# Patient Record
Sex: Male | Born: 1975
Health system: Southern US, Community
[De-identification: ages and names within clinical notes are randomized; demographics above are authoritative.]

## PROBLEM LIST (undated history)

## (undated) DIAGNOSIS — IMO0001 Reserved for inherently not codable concepts without codable children: Secondary | ICD-10-CM

## (undated) DIAGNOSIS — K219 Gastro-esophageal reflux disease without esophagitis: Secondary | ICD-10-CM

## (undated) DIAGNOSIS — R030 Elevated blood-pressure reading, without diagnosis of hypertension: Secondary | ICD-10-CM

## (undated) DIAGNOSIS — N390 Urinary tract infection, site not specified: Secondary | ICD-10-CM

## (undated) HISTORY — PX: SHOULDER SURGERY: SHX246

## (undated) HISTORY — DX: Reserved for inherently not codable concepts without codable children: IMO0001

## (undated) HISTORY — DX: Elevated blood-pressure reading, without diagnosis of hypertension: R03.0

## (undated) HISTORY — PX: WISDOM TOOTH EXTRACTION: SHX21

## (undated) HISTORY — DX: Gastro-esophageal reflux disease without esophagitis: K21.9

## (undated) HISTORY — DX: Urinary tract infection, site not specified: N39.0

---

## 2011-04-29 ENCOUNTER — Encounter (HOSPITAL_COMMUNITY): Payer: Self-pay | Admitting: *Deleted

## 2011-04-29 ENCOUNTER — Emergency Department (HOSPITAL_COMMUNITY): Payer: Self-pay

## 2011-04-29 ENCOUNTER — Emergency Department (HOSPITAL_COMMUNITY)
Admission: EM | Admit: 2011-04-29 | Discharge: 2011-04-29 | Disposition: A | Discharge status: Home / Self Care | Payer: Self-pay | Attending: Emergency Medicine | Admitting: Emergency Medicine

## 2011-04-29 DIAGNOSIS — R109 Unspecified abdominal pain: Secondary | ICD-10-CM | POA: Insufficient documentation

## 2011-04-29 DIAGNOSIS — R079 Chest pain, unspecified: Secondary | ICD-10-CM | POA: Insufficient documentation

## 2011-04-29 DIAGNOSIS — J45909 Unspecified asthma, uncomplicated: Secondary | ICD-10-CM | POA: Insufficient documentation

## 2011-04-29 LAB — DIFFERENTIAL
Basophils Absolute: 0.1 10*3/uL (ref 0.0–0.1)
Basophils Relative: 1 % (ref 0–1)
Eosinophils Absolute: 0.2 10*3/uL (ref 0.0–0.7)
Lymphocytes Relative: 20 % (ref 12–46)
Lymphs Abs: 1.2 10*3/uL (ref 0.7–4.0)
Monocytes Absolute: 0.4 10*3/uL (ref 0.1–1.0)
Monocytes Relative: 8 % (ref 3–12)
Neutro Abs: 3.9 10*3/uL (ref 1.7–7.7)
Neutrophils Relative %: 68 % (ref 43–77)

## 2011-04-29 LAB — CK TOTAL AND CKMB (NOT AT ARMC)
CK, MB: 2.1 ng/mL (ref 0.3–4.0)
Relative Index: 1.2 (ref 0.0–2.5)
Total CK: 171 U/L (ref 7–232)

## 2011-04-29 LAB — LIPASE, BLOOD: Lipase: 36 U/L (ref 11–59)

## 2011-04-29 LAB — COMPREHENSIVE METABOLIC PANEL
AST: 19 U/L (ref 0–37)
Albumin: 3.8 g/dL (ref 3.5–5.2)
BUN: 7 mg/dL (ref 6–23)
Calcium: 9.3 mg/dL (ref 8.4–10.5)
Creatinine, Ser: 1.11 mg/dL (ref 0.50–1.35)
Total Protein: 7.6 g/dL (ref 6.0–8.3)

## 2011-04-29 LAB — CBC
MCV: 81.5 fL (ref 78.0–100.0)
Platelets: 229 10*3/uL (ref 150–400)
RBC: 5.04 MIL/uL (ref 4.22–5.81)
WBC: 5.7 10*3/uL (ref 4.0–10.5)

## 2011-04-29 LAB — TROPONIN I: Troponin I: <0.3 ng/mL (ref <0.30)

## 2011-04-29 MED ORDER — MORPHINE SULFATE 4 MG/ML IJ SOLN
INTRAMUSCULAR | Status: AC
  Filled 2011-04-29: qty 1

## 2011-04-29 MED ORDER — IOHEXOL 300 MG/ML  SOLN
20.0000 mL | INTRAMUSCULAR | Status: AC
  Administered 2011-04-29 (×2): 20 mL via ORAL

## 2011-04-29 MED ORDER — MORPHINE SULFATE 4 MG/ML IJ SOLN
8.0000 mg | Freq: Once | INTRAMUSCULAR | Status: AC
  Administered 2011-04-29: 8 mg via INTRAVENOUS
  Filled 2011-04-29: qty 1

## 2011-04-29 MED ORDER — FAMOTIDINE 20 MG PO TABS: 20.0000 mg | ORAL_TABLET | Freq: Two times a day (BID) | ORAL | Status: DC

## 2011-04-29 MED ORDER — HYDROCODONE-ACETAMINOPHEN 5-325 MG PO TABS: 1.0000 | ORAL_TABLET | ORAL | Status: AC | PRN

## 2011-04-29 MED ORDER — IOHEXOL 300 MG/ML  SOLN
100.0000 mL | Freq: Once | INTRAMUSCULAR | Status: AC | PRN
  Administered 2011-04-29: 100 mL via INTRAVENOUS

## 2011-04-29 NOTE — ED Provider Notes (Signed)
History   This chart was scribed for Lyanne Co, MD by Clarita Crane. The patient was seen in room STRE3/STRE3. Patient's care was started at 1706.    CSN: [id number]  Arrival date & time 04/29/11  1706   None     Chief Complaint  Patient presents with  . Abdominal Pain     HPI Devin Tate is a 36 y.o. male who presents to the Emergency Department complaining of intermittent moderate left sided abdominal pain radiating to left side of chest onset 1 year ago which generally occurs after eating but pain became worse today and started without eating anything prior. Reports episodes of abdominal pain which started today has been constant since onset. Rates abdominal pain an 8 out of 10 currently but 10 out of 10 at its worst. Denies dysuria, nausea, vomiting, diarrhea, blood in stool, melena, constipation. Patient denies h/o abdominal surgery.   Past Medical History  Diagnosis Date  . Asthma     History reviewed. No pertinent past surgical history.  No family history on file.  History  Substance Use Topics  . Smoking status: Never Smoker   . Smokeless tobacco: Not on file  . Alcohol Use: Yes      Review of Systems  Constitutional: Negative for fever and chills.  Respiratory: Negative for shortness of breath.   Gastrointestinal: Positive for abdominal pain. Negative for nausea, vomiting, diarrhea and blood in stool.    Allergies  Review of patient's allergies indicates no known allergies.  Home Medications   Current Outpatient Rx  Name Route Sig Dispense Refill  . MELATONIN 5 MG PO TABS Oral Take 1 tablet by mouth at bedtime as needed. FOR INSOMNIA      BP 127/88  Pulse 71  Temp(Src) 98.4 F (36.9 C) (Oral)  Resp 18  SpO2 98%  Physical Exam  Nursing note and vitals reviewed. Constitutional: He is oriented to person, place, and time. He appears well-developed and well-nourished. No distress.  HENT:  Head: Normocephalic and atraumatic.  Eyes: EOM are  normal. Pupils are equal, round, and reactive to light.  Neck: Neck supple. No tracheal deviation present.  Cardiovascular: Normal rate.   Pulmonary/Chest: Effort normal. No respiratory distress.  Abdominal: Soft. He exhibits no distension. There is tenderness (left sided). There is no rebound and no guarding.  Musculoskeletal: Normal range of motion. He exhibits no edema.  Neurological: He is alert and oriented to person, place, and time. No sensory deficit.  Skin: Skin is warm and dry.  Psychiatric: He has a normal mood and affect. His behavior is normal.    ED Course  Procedures (including critical care time)   Date: 04/29/2011  Rate: 65  Rhythm: normal sinus rhythm  QRS Axis: normal  Intervals: normal  ST/T Wave abnormalities: normal  Conduction Disutrbances: none  Narrative Interpretation:   Old EKG Reviewed: No significant changes noted     DIAGNOSTIC STUDIES: Oxygen Saturation is 98% on room air, normal by my interpretation.    COORDINATION OF CARE: 7:35PM- Patient informed of current plan for treatment and evaluation and agrees with plan at this time.     Labs Reviewed  COMPREHENSIVE METABOLIC PANEL - Abnormal; Notable for the following:    Glucose, Bld 100 (*)    GFR calc non Af Amer 85 (*)    All other components within normal limits  CBC  DIFFERENTIAL  CK TOTAL AND CKMB  TROPONIN I  LIPASE, BLOOD   Dg Chest 2 View  04/29/2011  *RADIOLOGY REPORT*  Clinical Data: Chest and abdominal pain.  Hypertension  CHEST - 2 VIEW  Comparison: None  Findings: The heart size and mediastinal contours are within normal limits.  Both lungs are clear.  The visualized skeletal structures are unremarkable.  IMPRESSION: Negative exam.  Original Report Authenticated By: Rosealee Albee, M.D.     No diagnosis found.    MDM  The patient has had intermittent abdominal pain for approximately one year.  He is now tender on the left side of his abdomen this pain has been  constant.  This abdominal pain is changed from his prior baseline abdominal pain.  Labs and CT scan and urinalysis will be obtained today.  I suspect all of this to be normal.  The patient will then need to be referred to gastroenterology for workup of his ongoing intermittent abdominal pain.  The patient's pain will be treated.  The patient will continue his evaluation in the clinical decision unit.  I made nothing of his chest pain and he didn't even mention this to me until the very end.  His symptoms do not sound like a PE or ACS      I personally performed the services described in this documentation, which was scribed in my presence. The recorded information has been reviewed and considered.      Lyanne Co, MD 04/29/11 [id number]

## 2011-04-29 NOTE — ED Provider Notes (Signed)
Received patient from Dr Patria Mane at change of shift.  Patient with chronic left sided abdominal pain, worse immediately after eating.  Labs, CT unremarkable.  Plan is for d/c home with GI follow up.  I have discussed all results and plan with patient, will d/c home with H2 blocker and pain medication for break through pain.  I have discussed safety with pain medications with the patient, including monitoring tylenol intake.  Patient has no needs at present.  Patient verbalizes understanding and agrees with plan.    Rise Patience, [place] 04/29/11 2200

## 2011-04-29 NOTE — ED Notes (Signed)
The pt has been having lt sided chest pain and abd pain for one year.  He has a history of the same [place] state.  nausea

## 2011-04-29 NOTE — Discharge Instructions (Signed)
Read the information below.  Your labs and CT do not show the cause of your abdominal pain. Please call the GI specialist listed above to schedule a follow up appointment.  Do not take additional tylenol while taking the prescribed pain medication.  Do not drive or operate machinery while taking this medication.  You may return to the ER at any time for worsening condition or any new symptoms that concern you.   Abdominal Pain Abdominal pain can be caused by many things. Your caregiver decides the seriousness of your pain by an examination and possibly blood tests and X-rays. Many cases can be observed and treated at home. Most abdominal pain is not caused by a disease and will probably improve without treatment. However, in many cases, more time must pass before a clear cause of the pain can be found. Before that point, it may not be known if you need more testing, or if hospitalization or surgery is needed. HOME CARE INSTRUCTIONS   Do not take laxatives unless directed by your caregiver.   Take pain medicine only as directed by your caregiver.   Only take over-the-counter or prescription medicines for pain, discomfort, or fever as directed by your caregiver.   Try a clear liquid diet (broth, tea, or water) for as long as directed by your caregiver. Slowly move to a bland diet as tolerated.  SEEK IMMEDIATE MEDICAL CARE IF:   The pain does not go away.   You have a fever.   You keep throwing up (vomiting).   The pain is felt only in portions of the abdomen. Pain in the right side could possibly be appendicitis. In an adult, pain in the left lower portion of the abdomen could be colitis or diverticulitis.   You pass bloody or black tarry stools.  MAKE SURE YOU:   Understand these instructions.   Will watch your condition.   Will get help right away if you are not doing well or get worse.  Document Released: 10/02/2004 Document Revised: 12/12/2010 Document Reviewed:  08/11/2007 [facility] Patient Information 2012 [place], [place].

## 2011-04-29 NOTE — ED Provider Notes (Signed)
I was the primary provider of this patient during this ER visit. The patients care was continued in the CDU and managed in conjunction with my midlevel providers   Lyanne Co, MD 04/29/11 2230

## 2011-04-29 NOTE — ED Notes (Signed)
Pt c/o left abd pain off and on x's 1 yr. Also c/o chest pain off and on x's 1 yr.

## 2011-04-29 NOTE — ED Notes (Signed)
Pt drinking contrast at this time.

## 2013-10-11 ENCOUNTER — Encounter (HOSPITAL_COMMUNITY): Payer: Self-pay | Admitting: Emergency Medicine

## 2013-10-11 ENCOUNTER — Emergency Department (HOSPITAL_COMMUNITY): Payer: BC Managed Care – PPO

## 2013-10-11 ENCOUNTER — Emergency Department (HOSPITAL_COMMUNITY)
Admission: EM | Admit: 2013-10-11 | Discharge: 2013-10-11 | Disposition: A | Discharge status: Home / Self Care | Payer: BC Managed Care – PPO | Attending: Emergency Medicine | Admitting: Emergency Medicine

## 2013-10-11 DIAGNOSIS — R1110 Vomiting, unspecified: Secondary | ICD-10-CM | POA: Insufficient documentation

## 2013-10-11 DIAGNOSIS — R197 Diarrhea, unspecified: Secondary | ICD-10-CM | POA: Insufficient documentation

## 2013-10-11 DIAGNOSIS — J069 Acute upper respiratory infection, unspecified: Secondary | ICD-10-CM | POA: Diagnosis not present

## 2013-10-11 DIAGNOSIS — J45901 Unspecified asthma with (acute) exacerbation: Secondary | ICD-10-CM | POA: Insufficient documentation

## 2013-10-11 DIAGNOSIS — Z794 Long term (current) use of insulin: Secondary | ICD-10-CM | POA: Diagnosis not present

## 2013-10-11 DIAGNOSIS — R0602 Shortness of breath: Secondary | ICD-10-CM | POA: Diagnosis present

## 2013-10-11 LAB — I-STAT CHEM 8, ED
BUN: 12 mg/dL (ref 6–23)
CHLORIDE: 103 meq/L (ref 96–112)
CREATININE: 1.2 mg/dL (ref 0.50–1.35)
Calcium, Ion: 1.1 mmol/L — ABNORMAL LOW (ref 1.12–1.23)
GLUCOSE: 122 mg/dL — AB (ref 70–99)
HCT: 44 % (ref 39.0–52.0)
Hemoglobin: 15 g/dL (ref 13.0–17.0)
POTASSIUM: 4.2 meq/L (ref 3.7–5.3)
SODIUM: 140 meq/L (ref 137–147)
TCO2: 29 mmol/L (ref 0–100)

## 2013-10-11 MED ORDER — ONDANSETRON 8 MG PO TBDP: 8.0000 mg | ORAL_TABLET | Freq: Three times a day (TID) | ORAL | Status: DC | PRN

## 2013-10-11 MED ORDER — NAPROXEN 500 MG PO TABS: 500.0000 mg | ORAL_TABLET | Freq: Two times a day (BID) | ORAL | Status: DC

## 2013-10-11 MED ORDER — SODIUM CHLORIDE 0.9 % IV SOLN
1000.0000 mL | INTRAVENOUS | Status: DC
  Administered 2013-10-11: 1000 mL via INTRAVENOUS

## 2013-10-11 MED ORDER — ONDANSETRON HCL 4 MG/2ML IJ SOLN
4.0000 mg | Freq: Once | INTRAMUSCULAR | Status: AC
  Administered 2013-10-11: 4 mg via INTRAVENOUS
  Filled 2013-10-11: qty 2

## 2013-10-11 MED ORDER — BENZONATATE 100 MG PO CAPS: 100.0000 mg | ORAL_CAPSULE | Freq: Three times a day (TID) | ORAL | Status: DC

## 2013-10-11 MED ORDER — SODIUM CHLORIDE 0.9 % IV SOLN
1000.0000 mL | Freq: Once | INTRAVENOUS | Status: AC
Start: 2013-10-11 — End: 2013-10-11
  Administered 2013-10-11: 1000 mL via INTRAVENOUS

## 2013-10-11 NOTE — ED Notes (Signed)
Pt states shortness of breath, cough, nausea, congestion, fever and diarrhea x 72 hours.  Pt states highest fever was last night at 11 and was 102.4.  Took ibuprofen at 11pm.  No ibuprofen today.  Tempt 98.5 in triage.

## 2013-10-11 NOTE — Discharge Instructions (Signed)
Cough, Adult ° A cough is a reflex. It helps you clear your throat and airways. A cough can help heal your body. A cough can last 2 or 3 weeks (acute) or may last more than 8 weeks (chronic). Some common causes of a cough can include an infection, allergy, or a cold. °HOME CARE °· Only take medicine as told by your doctor. °· If given, take your medicines (antibiotics) as told. Finish them even if you start to feel better. °· Use a cold steam vaporizer or humidifier in your home. This can help loosen thick spit (secretions). °· Sleep so you are almost sitting up (semi-upright). Use pillows to do this. This helps reduce coughing. °· Rest as needed. °· Stop smoking if you smoke. °GET HELP RIGHT AWAY IF: °· You have yellowish-white fluid (pus) in your thick spit. °· Your cough gets worse. °· Your medicine does not reduce coughing, and you are losing sleep. °· You cough up blood. °· You have trouble breathing. °· Your pain gets worse and medicine does not help. °· You have a fever. °MAKE SURE YOU:  °· Understand these instructions. °· Will watch your condition. °· Will get help right away if you are not doing well or get worse. °Document Released: 09/05/2010 Document Revised: 05/09/2013 Document Reviewed: 09/05/2010 °ExitCare® Patient Information ©2015 [employer]. This information is not intended to replace advice given to you by your health care provider. Make sure you discuss any questions you have with your health care provider. ° °Upper Respiratory Infection, Adult °An upper respiratory infection (URI) is also sometimes known as the common cold. The upper respiratory tract includes the nose, sinuses, throat, trachea, and bronchi. Bronchi are the airways leading to the lungs. Most people improve within 1 week, but symptoms can last up to 2 weeks. A residual cough may last even longer.  °CAUSES °Many different viruses can infect the tissues lining the upper respiratory tract. The tissues become irritated and  inflamed and often become very moist. Mucus production is also common. A cold is contagious. You can easily spread the virus to others by oral contact. This includes kissing, sharing a glass, coughing, or sneezing. Touching your mouth or nose and then touching a surface, which is then touched by another person, can also spread the virus. °SYMPTOMS  °Symptoms typically develop 1 to 3 days after you come in contact with a cold virus. Symptoms vary from person to person. They may include: °· Runny nose. °· Sneezing. °· Nasal congestion. °· Sinus irritation. °· Sore throat. °· Loss of voice (laryngitis). °· Cough. °· Fatigue. °· Muscle aches. °· Loss of appetite. °· Headache. °· Low-grade fever. °DIAGNOSIS  °You might diagnose your own cold based on familiar symptoms, since most people get a cold 2 to 3 times a year. Your caregiver can confirm this based on your exam. Most importantly, your caregiver can check that your symptoms are not due to another disease such as strep throat, sinusitis, pneumonia, asthma, or epiglottitis. Blood tests, throat tests, and X-rays are not necessary to diagnose a common cold, but they may sometimes be helpful in excluding other more serious diseases. Your caregiver will decide if any further tests are required. °RISKS AND COMPLICATIONS  °You may be at risk for a more severe case of the common cold if you smoke cigarettes, have chronic heart disease (such as heart failure) or lung disease (such as asthma), or if you have a weakened immune system. The very young and very old are   also at risk for more serious infections. Bacterial sinusitis, middle ear infections, and bacterial pneumonia can complicate the common cold. The common cold can worsen asthma and chronic obstructive pulmonary disease (COPD). Sometimes, these complications can require emergency medical care and may be life-threatening. °PREVENTION  °The best way to protect against getting a cold is to practice good hygiene. Avoid  oral or hand contact with people with cold symptoms. Wash your hands often if contact occurs. There is no clear evidence that vitamin C, vitamin E, echinacea, or exercise reduces the chance of developing a cold. However, it is always recommended to get plenty of rest and practice good nutrition. °TREATMENT  °Treatment is directed at relieving symptoms. There is no cure. Antibiotics are not effective, because the infection is caused by a virus, not by bacteria. Treatment may include: °· Increased fluid intake. Sports drinks offer valuable electrolytes, sugars, and fluids. °· Breathing heated mist or steam (vaporizer or shower). °· Eating chicken soup or other clear broths, and maintaining good nutrition. °· Getting plenty of rest. °· Using gargles or lozenges for comfort. °· Controlling fevers with ibuprofen or acetaminophen as directed by your caregiver. °· Increasing usage of your inhaler if you have asthma. °Zinc gel and zinc lozenges, taken in the first 24 hours of the common cold, can shorten the duration and lessen the severity of symptoms. Pain medicines may help with fever, muscle aches, and throat pain. A variety of non-prescription medicines are available to treat congestion and runny nose. Your caregiver can make recommendations and may suggest nasal or lung inhalers for other symptoms.  °HOME CARE INSTRUCTIONS  °· Only take over-the-counter or prescription medicines for pain, discomfort, or fever as directed by your caregiver. °· Use a warm mist humidifier or inhale steam from a shower to increase air moisture. This may keep secretions moist and make it easier to breathe. °· Drink enough water and fluids to keep your urine clear or pale yellow. °· Rest as needed. °· Return to work when your temperature has returned to normal or as your caregiver advises. You may need to stay home longer to avoid infecting others. You can also use a face mask and careful hand washing to prevent spread of the virus. °SEEK  MEDICAL CARE IF:  °· After the first few days, you feel you are getting worse rather than better. °· You need your caregiver's advice about medicines to control symptoms. °· You develop chills, worsening shortness of breath, or brown or red sputum. These may be signs of pneumonia. °· You develop yellow or brown nasal discharge or pain in the face, especially when you bend forward. These may be signs of sinusitis. °· You develop a fever, swollen neck glands, pain with swallowing, or white areas in the back of your throat. These may be signs of strep throat. °SEEK IMMEDIATE MEDICAL CARE IF:  °· You have a fever. °· You develop severe or persistent headache, ear pain, sinus pain, or chest pain. °· You develop wheezing, a prolonged cough, cough up blood, or have a change in your usual mucus (if you have chronic lung disease). °· You develop sore muscles or a stiff neck. °Document Released: 06/18/2000 Document Revised: 03/17/2011 Document Reviewed: 03/30/2013 °ExitCare® Patient Information ©2015 [employer]. This information is not intended to replace advice given to you by your health care provider. Make sure you discuss any questions you have with your health care provider. ° °

## 2013-10-11 NOTE — ED Provider Notes (Signed)
CSN: [id number]     Arrival date & time 10/11/13  4098 History   First MD Initiated Contact with Patient 10/11/13 [phone]     Chief Complaint  Patient presents with  . Shortness of Breath  . Generalized Body Aches    Patient is a 38 y.o. male presenting with shortness of breath. The history is provided by the patient.  Shortness of Breath Severity:  Moderate Onset quality:  Gradual Duration:  3 days Timing:  Constant Progression:  Worsening Relieved by:  Nothing Associated symptoms: cough, fever and vomiting   Associated symptoms: no abdominal pain, no chest pain, no sore throat, no sputum production and no wheezing   Symptoms have progressed over the last few days.  Fever up to 102.  Did not feel well enough to go to work today.  Some vomiting and diarrhea.  No abdominal pain.  Diffuse myalgias.  No foreign travel.  No abx recently.  Past Medical History  Diagnosis Date  . Asthma    History reviewed. No pertinent past surgical history. History reviewed. No pertinent family history. History  Substance Use Topics  . Smoking status: Never Smoker   . Smokeless tobacco: Not on file  . Alcohol Use: Yes    Review of Systems  Constitutional: Positive for fever.  HENT: Negative for sore throat.   Respiratory: Positive for cough and shortness of breath. Negative for sputum production and wheezing.   Cardiovascular: Negative for chest pain.  Gastrointestinal: Positive for vomiting and diarrhea. Negative for abdominal pain.      Allergies  Review of patient's allergies indicates no known allergies.  Home Medications   Prior to Admission medications   Medication Sig Start Date End Date Taking? Authorizing Provider  ibuprofen (ADVIL,MOTRIN) 200 MG tablet Take 600 mg by mouth once.   Yes Historical Provider, MD  Melatonin 5 MG TABS Take 1 tablet by mouth at bedtime as needed. FOR INSOMNIA   Yes Historical Provider, MD   BP 153/87  Pulse 74  Temp(Src) 98.5 F (36.9 C) (Oral)   Ht 5\' 11"  (1.803 m)  Wt 220 lb (99.791 kg)  BMI 30.70 kg/m2  SpO2 97% Physical Exam  Nursing note and vitals reviewed. Constitutional: He appears well-developed and well-nourished. No distress.  HENT:  Head: Normocephalic and atraumatic.  Right Ear: External ear normal.  Left Ear: External ear normal.  Eyes: Conjunctivae are normal. Right eye exhibits no discharge. Left eye exhibits no discharge. No scleral icterus.  Neck: Neck supple. No tracheal deviation present.  Cardiovascular: Normal rate, regular rhythm and intact distal pulses.   Pulmonary/Chest: Effort normal and breath sounds normal. No stridor. No respiratory distress. He has no wheezes. He has no rales.  Abdominal: Soft. Bowel sounds are normal. He exhibits no distension. There is no tenderness. There is no rebound and no guarding.  Musculoskeletal: He exhibits no edema and no tenderness.  Neurological: He is alert. He has normal strength. No cranial nerve deficit (no facial droop, extraocular movements intact, no slurred speech) or sensory deficit. He exhibits normal muscle tone. He displays no seizure activity. Coordination normal.  Skin: Skin is warm and dry. No rash noted.  Psychiatric: He has a normal mood and affect.    ED Course  Procedures (including critical care time) Labs Review Labs Reviewed  I-STAT CHEM 8, ED - Abnormal; Notable for the following:    Glucose, Bld 122 (*)    Calcium, Ion 1.10 (*)    All other components within normal limits  Imaging Review Dg Chest 2 View  10/11/2013   CLINICAL DATA:  Shortness of breath, generalized body ache for 3 days, nonsmoker  EXAM: CHEST  2 VIEW  COMPARISON:  04/29/2011  FINDINGS: Cardiomediastinal silhouette is stable. No acute infiltrate or pleural effusion. No pulmonary edema. Bony thorax is unremarkable.  IMPRESSION: No active cardiopulmonary disease.   Electronically Signed   By: Natasha MeadLiviu  Pop M.D.   On: 10/11/2013 09:48     EKG Interpretation   Date/Time:   Tuesday October 11 2013 09:11:53 EDT Ventricular Rate:  70 PR Interval:  152 QRS Duration: 84 QT Interval:  384 QTC Calculation: 414 R Axis:   50 Text Interpretation:  Sinus rhythm Baseline wander in lead(s) V2 No  significant change since last tracing EKG should not have been ordered,  please discard Confirmed by Kendricks Reap  MD-J, Torrie Lafavor ([id number]) on 10/11/2013 9:19:49  AM      MDM   Final diagnoses:  URI, acute    Symptoms are consistent with a simple upper respiratory infection. There is no evidence to suggest pneumonia on my exam. The patient does not appear to have an otitis media. I discussed supportive treatment. I encouraged followup with the primary care doctor next week if symptoms have not resolved. Warning signs and reasons to return to the emergency room were discussed      Linwood DibblesJon Yulieth Carrender, MD 10/11/13 1050

## 2013-11-07 ENCOUNTER — Ambulatory Visit: Payer: Self-pay | Admitting: Internal Medicine

## 2014-01-18 ENCOUNTER — Ambulatory Visit (INDEPENDENT_AMBULATORY_CARE_PROVIDER_SITE_OTHER): Payer: BLUE CROSS/BLUE SHIELD | Admitting: Internal Medicine

## 2014-01-18 ENCOUNTER — Encounter: Payer: Self-pay | Admitting: Internal Medicine

## 2014-01-18 VITALS — BP 138/90 | HR 75 | Temp 97.5°F | Ht 71.0 in | Wt 222.0 lb

## 2014-01-18 DIAGNOSIS — IMO0001 Reserved for inherently not codable concepts without codable children: Secondary | ICD-10-CM | POA: Insufficient documentation

## 2014-01-18 DIAGNOSIS — F419 Anxiety disorder, unspecified: Secondary | ICD-10-CM | POA: Insufficient documentation

## 2014-01-18 DIAGNOSIS — Z0000 Encounter for general adult medical examination without abnormal findings: Secondary | ICD-10-CM

## 2014-01-18 DIAGNOSIS — Z23 Encounter for immunization: Secondary | ICD-10-CM

## 2014-01-18 DIAGNOSIS — K219 Gastro-esophageal reflux disease without esophagitis: Secondary | ICD-10-CM

## 2014-01-18 DIAGNOSIS — F329 Major depressive disorder, single episode, unspecified: Secondary | ICD-10-CM | POA: Insufficient documentation

## 2014-01-18 DIAGNOSIS — R030 Elevated blood-pressure reading, without diagnosis of hypertension: Secondary | ICD-10-CM

## 2014-01-18 DIAGNOSIS — N529 Male erectile dysfunction, unspecified: Secondary | ICD-10-CM

## 2014-01-18 MED ORDER — OMEPRAZOLE 40 MG PO CPDR: 40.0000 mg | DELAYED_RELEASE_CAPSULE | Freq: Every day | ORAL | Status: DC

## 2014-01-18 MED ORDER — ZOLPIDEM TARTRATE 10 MG PO TABS: 5.0000 mg | ORAL_TABLET | Freq: Every evening | ORAL | Status: DC | PRN

## 2014-01-18 NOTE — Progress Notes (Signed)
Subjective:    Patient ID: Devin Tate, male    DOB: [birth year], 39 y.o.   MRN: [id number]  DOS:  01/18/2014 Type of visit - description : New patient, here with his wife, CPX. Interval history: Reports several issues: BP was elevated in 2012 when he had his last physical exam, ambulatory BPs are in the 130s when check. Problems with difficulty sleeping, staying slightly more than falling asleep. Benadryl made him tired the next day, taking melatonin without much help. Some fatigue but denies feeling sleepy thought the day Also reports several year history of anterior chest pain when he swallows mostly solids, occasional dysphagia. Has not been checked by a MD before  for this concern. Also, a month ago he got a new position in his Job, more responsibility, more stress. At the same time has noted that decreased libido and occasional ED.   ROS Denies fever, chills. No unexplained weight loss No difficulty breathing No nausea, vomiting, diarrhea or blood in the stools. Some constipation. He does have heartburn frequently. No depression per se but he somebody who worries a lot, in addition with his new job he has been feeling slightly anxious. No hematuria, difficulty urinating but a month ago had a few days of mild dysuria. No penile discharge.  Past Medical History  Diagnosis Date  . Asthma     as a child  . GERD (gastroesophageal reflux disease)   . Elevated BP   . UTI (lower urinary tract infection)     h/o x 2     Past Surgical History  Procedure Laterality Date  . Shoulder surgery      History   Social History  . Marital Status: Married    Spouse Name: N/A    Number of Children: 3  . Years of Education: N/A   Occupational History  . machine operator     Social History Main Topics  . Smoking status: Never Smoker   . Smokeless tobacco: Never Used  . Alcohol Use: 0.0 oz/week    0 Not specified per week     Comment: socially   . Drug Use: No  . Sexual  Activity: Not on file   Other Topics Concern  . Not on file   Social History Narrative   3 children---17,15,9     Family History  Problem Relation Age of Onset  . Lupus Mother   . Colon cancer Neg Hx   . Prostate cancer Father   . COPD Father     smoker  . CAD Neg Hx   . Diabetes Father        Medication List       This list is accurate as of: 01/18/14 11:59 PM.  Always use your most recent med list.               Melatonin 5 MG Tabs  Take 1 tablet by mouth at bedtime as needed. FOR INSOMNIA     omeprazole 40 MG capsule  Commonly known as:  PRILOSEC  Take 1 capsule (40 mg total) by mouth daily.     zolpidem 10 MG tablet  Commonly known as:  AMBIEN  Take 0.5-1 tablets (5-10 mg total) by mouth at bedtime as needed for sleep.           Objective:   Physical Exam BP 138/90 mmHg  Pulse 75  Temp(Src) 97.5 F (36.4 C) (Oral)  Ht  (1.803 m)  Wt 222 lb (100.699 kg)  BMI  30.98 kg/m2  SpO2 92% General -- alert, well-developed, NAD.  Neck --no thyromegaly , normal carotid pulse  HEENT-- Not pale.  Lungs -- normal respiratory effort, no intercostal retractions, no accessory muscle use, and normal breath sounds.  Heart-- normal rate, regular rhythm, no murmur.  Abdomen-- Not distended, good bowel sounds,soft, non-tender. GU-- Penis normal to inspection on palpation, no discharge. Testicles normal to palpation, epididymis without tenderness  no inguinal hernias Extremities-- no pretibial edema bilaterally  Neurologic--  alert & oriented X3. Speech normal, gait appropriate for age, strength symmetric and appropriate for age.   Psych-- Cognition and judgment appear intact. Cooperative with normal attention span and concentration. No anxious or depressed appearing.        Assessment & Plan:   In addition to his physical exam, today , I spent additional  >17   min with the patient: >50% of the time counseling regards GERD precautions, adressing rectal  dysfunction, insomnia, counseling abouthealthy sleep habits. Also reviewing the chart and labs ordered by other providers  Also coordinating his care

## 2014-01-18 NOTE — Assessment & Plan Note (Addendum)
history of heartburn, also complaining of odynophagia since at least 2012 when he saw the last doctor. No fever chills or weight loss Plan: Protonix daily, GERD precautions, return to the office 2 months, if symptoms continue refer to GI

## 2014-01-18 NOTE — Assessment & Plan Note (Signed)
Td 2014 and today Diet and exercise discussed Labs including testosterone level

## 2014-01-18 NOTE — Assessment & Plan Note (Addendum)
Some difficulty with erections and decreased libido for the last few weeks, this happened in the context of a new position at his job and increased stress. Patient is counseled , I'm not opposed to try Viagra but we decided to re asses on RTC We'll also check a testosterone level for completeness

## 2014-01-18 NOTE — Progress Notes (Signed)
Pre visit review using our clinic review tool, if applicable. No additional management support is needed unless otherwise documented below in the visit note. 

## 2014-01-18 NOTE — Assessment & Plan Note (Signed)
BP today 138/90, ambulatory BPs in the 130s, EKG today sinus rhythm with no LVH. Plan: Healthy lifestyle, monitor BPs from time to time

## 2014-01-18 NOTE — Patient Instructions (Signed)
Stop by the front desk and schedule labs to be done within few days (fasting)   omeprazole 1 tablet every morning before breakfast  Try Ambien half or one tablet at night to help you sleep  Come back in 2 months for a checkup   HEALTHY SLEEP Sleep hygiene: Basic rules for a good night's sleep  Sleep only as much as you need to feel rested and then get out of bed  Keep a regular sleep schedule  Avoid forcing sleep  Exercise regularly for at least 20 minutes, preferably 4 to 5 hours before bedtime  Avoid caffeinated beverages after lunch  Avoid alcohol near bedtime: no "night cap"  Avoid smoking, especially in the evening  Do not go to bed hungry  Adjust bedroom environment  Avoid prolonged use of light-emitting screens before bedtime   Deal with your worries before bedtime     Food Choices for Gastroesophageal Reflux Disease When you have gastroesophageal reflux disease (GERD), the foods you eat and your eating habits are very important. Choosing the right foods can help ease the discomfort of GERD. WHAT GENERAL GUIDELINES DO I NEED TO FOLLOW?  Choose fruits, vegetables, whole grains, low-fat dairy products, and low-fat meat, fish, and poultry.  Limit fats such as oils, salad dressings, butter, nuts, and avocado.  Keep a food diary to identify foods that cause symptoms.  Avoid foods that cause reflux. These may be different for different people.  Eat frequent small meals instead of three large meals each day.  Eat your meals slowly, in a relaxed setting.  Limit fried foods.  Cook foods using methods other than frying.  Avoid drinking alcohol.  Avoid drinking large amounts of liquids with your meals.  Avoid bending over or lying down until 2-3 hours after eating. WHAT FOODS ARE NOT RECOMMENDED? The following are some foods and drinks that may worsen your symptoms: Vegetables Tomatoes. Tomato juice. Tomato and spaghetti sauce. Chili peppers. Onion and garlic.  Horseradish. Fruits Oranges, grapefruit, and lemon (fruit and juice). Meats High-fat meats, fish, and poultry. This includes hot dogs, ribs, ham, sausage, salami, and bacon. Dairy Whole milk and chocolate milk. Sour cream. Cream. Butter. Ice cream. Cream cheese.  Beverages Coffee and tea, with or without caffeine. Carbonated beverages or energy drinks. Condiments Hot sauce. Barbecue sauce.  Sweets/Desserts Chocolate and cocoa. Donuts. Peppermint and spearmint. Fats and Oils High-fat foods, including [place] fries and potato chips. Other Vinegar. Strong spices, such as black pepper, white pepper, red pepper, cayenne, curry powder, cloves, ginger, and chili powder. The items listed above may not be a complete list of foods and beverages to avoid. Contact your dietitian for more information. Document Released: 12/23/2004 Document Revised: 12/28/2012 Document Reviewed: 10/27/2012 [facility] Patient Information 2015 [employer] [place]. This information is not intended to replace advice given to you by your health care provider. Make sure you discuss any questions you have with your health care provider.

## 2014-01-19 NOTE — Assessment & Plan Note (Addendum)
Has a history of anxiety, states he worries constantly. Symptoms exacerbated by a new position in his job. Also complaining of difficulty sleeping, Benadryl and melatonin are not working well for him. Plan: Sleep habits discuss, start Ambien reassess on return to the office

## 2014-01-19 NOTE — Addendum Note (Signed)
Addended by: Willow OraPAZ, Rayona Sardinha E on: 01/19/2014 02:47 PM   Modules accepted: Level of Service

## 2014-01-20 ENCOUNTER — Other Ambulatory Visit (INDEPENDENT_AMBULATORY_CARE_PROVIDER_SITE_OTHER): Payer: BLUE CROSS/BLUE SHIELD

## 2014-01-20 DIAGNOSIS — Z0000 Encounter for general adult medical examination without abnormal findings: Secondary | ICD-10-CM

## 2014-01-20 LAB — COMPREHENSIVE METABOLIC PANEL
ALT: 24 U/L (ref 0–53)
AST: 24 U/L (ref 0–37)
Albumin: 4.1 g/dL (ref 3.5–5.2)
Alkaline Phosphatase: 78 U/L (ref 39–117)
BILIRUBIN TOTAL: 0.7 mg/dL (ref 0.2–1.2)
BUN: 14 mg/dL (ref 6–23)
CO2: 27 meq/L (ref 19–32)
Calcium: 9.3 mg/dL (ref 8.4–10.5)
Chloride: 102 mEq/L (ref 96–112)
Creatinine, Ser: 1.25 mg/dL (ref 0.40–1.50)
GFR: 83.04 mL/min (ref >60.00)
Glucose, Bld: 96 mg/dL (ref 70–99)
POTASSIUM: 3.8 meq/L (ref 3.5–5.1)
SODIUM: 135 meq/L (ref 135–145)
Total Protein: 8 g/dL (ref 6.0–8.3)

## 2014-01-20 LAB — LIPID PANEL
CHOL/HDL RATIO: 4
CHOLESTEROL: 144 mg/dL (ref 0–200)
HDL: 39.1 mg/dL (ref >39.00)
LDL Cholesterol: 93 mg/dL (ref 0–99)
NonHDL: 104.9
TRIGLYCERIDES: 60 mg/dL (ref 0.0–149.0)
VLDL: 12 mg/dL (ref 0.0–40.0)

## 2014-01-20 LAB — CBC WITH DIFFERENTIAL/PLATELET
BASOS PCT: 0.7 % (ref 0.0–3.0)
Basophils Absolute: 0.1 10*3/uL (ref 0.0–0.1)
Eosinophils Absolute: 0.2 10*3/uL (ref 0.0–0.7)
Eosinophils Relative: 2.6 % (ref 0.0–5.0)
HCT: 41.2 % (ref 39.0–52.0)
Hemoglobin: 13.8 g/dL (ref 13.0–17.0)
Lymphocytes Relative: 19.7 % (ref 12.0–46.0)
Lymphs Abs: 1.4 10*3/uL (ref 0.7–4.0)
MCHC: 33.6 g/dL (ref 30.0–36.0)
MCV: 85.5 fl (ref 78.0–100.0)
Monocytes Absolute: 0.5 10*3/uL (ref 0.1–1.0)
Monocytes Relative: 7.1 % (ref 3.0–12.0)
Neutro Abs: 5 10*3/uL (ref 1.4–7.7)
Neutrophils Relative %: 69.9 % (ref 43.0–77.0)
PLATELETS: 256 10*3/uL (ref 150.0–400.0)
RBC: 4.81 Mil/uL (ref 4.22–5.81)
RDW: 15.6 % — AB (ref 11.5–15.5)
WBC: 7.2 10*3/uL (ref 4.0–10.5)

## 2014-01-20 LAB — TSH: TSH: 1.69 u[IU]/mL (ref 0.35–4.50)

## 2014-01-23 LAB — TESTOSTERONE, FREE, TOTAL, SHBG
Sex Hormone Binding: 28 nmol/L (ref 10–50)
TESTOSTERONE FREE: 79.7 pg/mL (ref 47.0–244.0)
TESTOSTERONE-% FREE: 2.2 % (ref 1.6–2.9)
Testosterone: 362 ng/dL (ref 300–890)

## 2014-01-31 ENCOUNTER — Telehealth: Payer: Self-pay

## 2014-01-31 NOTE — Telephone Encounter (Signed)
UDS: 01/20/2014  Negative for Ambien: Okay per Dr. Drue NovelPaz   Per Dr. Drue NovelPaz no need for UDS when only taking Ambien.  Low risk per Dr. Drue NovelPaz 01/31/14

## 2014-02-24 ENCOUNTER — Other Ambulatory Visit: Payer: Self-pay | Admitting: Internal Medicine

## 2014-02-24 NOTE — Telephone Encounter (Signed)
Pt is requesting refill on Ambien.  Last OV: 01/18/2014 Last Fill: 01/18/2014 # [phone] [id number]   Please advise.

## 2014-02-24 NOTE — Telephone Encounter (Signed)
Print #30, 1 RF

## 2014-02-24 NOTE — Telephone Encounter (Signed)
Faxed to [facility] pharmacy.  

## 2014-02-24 NOTE — Telephone Encounter (Signed)
Printed, awaiting signature by Dr. Paz.  

## 2014-03-21 ENCOUNTER — Encounter: Payer: Self-pay | Admitting: Internal Medicine

## 2014-03-21 ENCOUNTER — Ambulatory Visit (INDEPENDENT_AMBULATORY_CARE_PROVIDER_SITE_OTHER): Payer: BLUE CROSS/BLUE SHIELD | Admitting: Internal Medicine

## 2014-03-21 VITALS — BP 124/78 | HR 60 | Temp 98.0°F | Ht 71.0 in | Wt 226.2 lb

## 2014-03-21 DIAGNOSIS — R030 Elevated blood-pressure reading, without diagnosis of hypertension: Secondary | ICD-10-CM

## 2014-03-21 DIAGNOSIS — N529 Male erectile dysfunction, unspecified: Secondary | ICD-10-CM

## 2014-03-21 DIAGNOSIS — F419 Anxiety disorder, unspecified: Secondary | ICD-10-CM

## 2014-03-21 DIAGNOSIS — IMO0001 Reserved for inherently not codable concepts without codable children: Secondary | ICD-10-CM

## 2014-03-21 DIAGNOSIS — K219 Gastro-esophageal reflux disease without esophagitis: Secondary | ICD-10-CM

## 2014-03-21 MED ORDER — ZOLPIDEM TARTRATE 10 MG PO TABS: 5.0000 mg | ORAL_TABLET | Freq: Every evening | ORAL | Status: DC | PRN

## 2014-03-21 NOTE — Assessment & Plan Note (Signed)
Improved on PPIs, no dysphasia or odynophagia, continue PPIs, okay to reduce to every other day.

## 2014-03-21 NOTE — Progress Notes (Signed)
Pre visit review using our clinic review tool, if applicable. No additional management support is needed unless otherwise documented below in the visit note. 

## 2014-03-21 NOTE — Progress Notes (Signed)
   Subjective:    Patient ID: Devin Tate, male    DOB: 1975/07/27, 39 y.o.   MRN: [id number]  DOS:  03/21/2014 Type of visit - description : f/u from previous visit Interval history: GERD-- better on PPIs ED, still an issue but improving. BP was elevated at last OV, not ambulatory BPs, BP today very good. Insomnia, Ambien helps but if he doesn't taking he does not sleep well. He had some stress, stress has decrease, working now dayshift which is better for him.   Review of Systems No chest pain, difficulty breathing No nausea, vomiting, diarrhea. No dysphasia or odynophagia  Past Medical History  Diagnosis Date  . Asthma     as a child  . GERD (gastroesophageal reflux disease)   . Elevated BP   . UTI (lower urinary tract infection)     h/o x 2     Past Surgical History  Procedure Laterality Date  . Shoulder surgery      History   Social History  . Marital Status: Married    Spouse Name: N/A  . Number of Children: 3  . Years of Education: N/A   Occupational History  . machine operator     Social History Main Topics  . Smoking status: Never Smoker   . Smokeless tobacco: Never Used  . Alcohol Use: 0.0 oz/week    0 Standard drinks or equivalent per week     Comment: socially   . Drug Use: No  . Sexual Activity: Not on file   Other Topics Concern  . Not on file   Social History Narrative   3 children---17,15,9        Medication List       This list is accurate as of: 03/21/14  8:45 PM.  Always use your most recent med list.               Melatonin 5 MG Tabs  Take 1 tablet by mouth at bedtime as needed. FOR INSOMNIA     omeprazole 40 MG capsule  Commonly known as:  PRILOSEC  Take 1 capsule (40 mg total) by mouth daily.     zolpidem 10 MG tablet  Commonly known as:  AMBIEN  Take 0.5-1 tablets (5-10 mg total) by mouth at bedtime as needed. for sleep           Objective:   Physical Exam BP 124/78 mmHg  Pulse 60  Temp(Src) 98 F (36.7  C) (Oral)  Ht 5\' 11"  (1.803 m)  Wt 226 lb 4 oz (102.626 kg)  BMI 31.57 kg/m2  SpO2 97% General:   Well developed, well nourished . NAD.  HEENT:  Normocephalic . Face symmetric, atraumatic Skin: Not pale. Not jaundice Neurologic:  alert & oriented X3.  Speech normal, gait appropriate for age and unassisted Psych--  Cognition and judgment appear intact.  Cooperative with normal attention span and concentration.  Behavior appropriate. No anxious or depressed appearing.       Assessment & Plan:

## 2014-03-21 NOTE — Assessment & Plan Note (Signed)
Slightly improved without any intervention

## 2014-03-21 NOTE — Patient Instructions (Signed)
  Come back to the office in 4 months  for a routine check up     HEALTHY SLEEP Sleep hygiene: Basic rules for a good night's sleep  Sleep only as much as you need to feel rested and then get out of bed  Keep a regular sleep schedule  Avoid forcing sleep  Exercise regularly for at least 20 minutes, preferably 4 to 5 hours before bedtime  Avoid caffeinated beverages after lunch  Avoid alcohol near bedtime: no "night cap"  Avoid smoking, especially in the evening  Do not go to bed hungry  Adjust bedroom environment  Avoid prolonged use of light-emitting screens before bedtime   Deal with your worries before bedtime

## 2014-03-21 NOTE — Assessment & Plan Note (Signed)
BP normal today, not ambulatory BPs, will recheck when he comes back

## 2014-03-21 NOTE — Assessment & Plan Note (Signed)
Stress decreased.  Sleeping better with Ambien but concern about getting "hooked" to it. We had extensive discussion about good sleep habits including get rid of caffeine etc. Time spent more than 15 minutes

## 2014-07-21 ENCOUNTER — Ambulatory Visit: Payer: BLUE CROSS/BLUE SHIELD | Admitting: Internal Medicine

## 2014-12-15 ENCOUNTER — Emergency Department (HOSPITAL_COMMUNITY): Payer: No Typology Code available for payment source

## 2014-12-15 ENCOUNTER — Encounter (HOSPITAL_COMMUNITY): Payer: Self-pay | Admitting: Emergency Medicine

## 2014-12-15 ENCOUNTER — Emergency Department (HOSPITAL_COMMUNITY)
Admission: EM | Admit: 2014-12-15 | Discharge: 2014-12-15 | Disposition: A | Discharge status: Home / Self Care | Payer: No Typology Code available for payment source | Attending: Emergency Medicine | Admitting: Emergency Medicine

## 2014-12-15 DIAGNOSIS — Y998 Other external cause status: Secondary | ICD-10-CM | POA: Insufficient documentation

## 2014-12-15 DIAGNOSIS — S3992XA Unspecified injury of lower back, initial encounter: Secondary | ICD-10-CM | POA: Insufficient documentation

## 2014-12-15 DIAGNOSIS — Z87440 Personal history of urinary (tract) infections: Secondary | ICD-10-CM | POA: Insufficient documentation

## 2014-12-15 DIAGNOSIS — Y92410 Unspecified street and highway as the place of occurrence of the external cause: Secondary | ICD-10-CM | POA: Insufficient documentation

## 2014-12-15 DIAGNOSIS — S0990XA Unspecified injury of head, initial encounter: Secondary | ICD-10-CM | POA: Diagnosis not present

## 2014-12-15 DIAGNOSIS — K219 Gastro-esophageal reflux disease without esophagitis: Secondary | ICD-10-CM | POA: Diagnosis not present

## 2014-12-15 DIAGNOSIS — J45909 Unspecified asthma, uncomplicated: Secondary | ICD-10-CM | POA: Diagnosis not present

## 2014-12-15 DIAGNOSIS — I10 Essential (primary) hypertension: Secondary | ICD-10-CM | POA: Insufficient documentation

## 2014-12-15 DIAGNOSIS — Y9389 Activity, other specified: Secondary | ICD-10-CM | POA: Insufficient documentation

## 2014-12-15 MED ORDER — ACETAMINOPHEN 325 MG PO TABS
650.0000 mg | ORAL_TABLET | Freq: Once | ORAL | Status: AC
  Administered 2014-12-15: 650 mg via ORAL
  Filled 2014-12-15: qty 2

## 2014-12-15 MED ORDER — ACETAMINOPHEN 500 MG PO TABS: 500.0000 mg | ORAL_TABLET | Freq: Four times a day (QID) | ORAL | Status: AC | PRN

## 2014-12-15 MED ORDER — IBUPROFEN 800 MG PO TABS: 800.0000 mg | ORAL_TABLET | Freq: Three times a day (TID) | ORAL | Status: DC

## 2014-12-15 MED ORDER — ACETAMINOPHEN 500 MG PO TABS: 500.0000 mg | ORAL_TABLET | Freq: Four times a day (QID) | ORAL | Status: DC | PRN

## 2014-12-15 MED ORDER — IBUPROFEN 800 MG PO TABS
800.0000 mg | ORAL_TABLET | Freq: Once | ORAL | Status: AC
Start: 2014-12-15 — End: 2014-12-15
  Administered 2014-12-15: 800 mg via ORAL
  Filled 2014-12-15: qty 1
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
## 2014-12-15 NOTE — Discharge Instructions (Signed)
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
## 2014-12-18 ENCOUNTER — Ambulatory Visit: Payer: BLUE CROSS/BLUE SHIELD | Admitting: Physician Assistant

## 2014-12-18 ENCOUNTER — Encounter: Payer: Self-pay | Admitting: Internal Medicine

## 2014-12-18 ENCOUNTER — Ambulatory Visit (INDEPENDENT_AMBULATORY_CARE_PROVIDER_SITE_OTHER): Payer: BLUE CROSS/BLUE SHIELD | Admitting: Internal Medicine

## 2014-12-18 ENCOUNTER — Ambulatory Visit (HOSPITAL_BASED_OUTPATIENT_CLINIC_OR_DEPARTMENT_OTHER)
Admission: RE | Admit: 2014-12-18 | Discharge: 2014-12-18 | Disposition: A | Discharge status: Home / Self Care | Payer: No Typology Code available for payment source | Source: Ambulatory Visit | Attending: Internal Medicine | Admitting: Internal Medicine

## 2014-12-18 VITALS — BP 122/66 | HR 82 | Temp 98.4°F | Ht 71.0 in | Wt 237.4 lb

## 2014-12-18 DIAGNOSIS — Z09 Encounter for follow-up examination after completed treatment for conditions other than malignant neoplasm: Secondary | ICD-10-CM

## 2014-12-18 DIAGNOSIS — M542 Cervicalgia: Secondary | ICD-10-CM

## 2014-12-18 DIAGNOSIS — M545 Low back pain, unspecified: Secondary | ICD-10-CM

## 2014-12-18 MED ORDER — CYCLOBENZAPRINE HCL 10 MG PO TABS: 10.0000 mg | ORAL_TABLET | Freq: Two times a day (BID) | ORAL | Status: DC | PRN

## 2014-12-18 MED ORDER — IBUPROFEN 800 MG PO TABS: 800.0000 mg | ORAL_TABLET | Freq: Two times a day (BID) | ORAL | Status: AC | PRN

## 2014-12-18 NOTE — Progress Notes (Signed)
Pre visit review using our clinic review tool, if applicable. No additional management support is needed unless otherwise documented below in the visit note. 

## 2014-12-18 NOTE — Patient Instructions (Signed)
Take Flexeril, a muscle relaxant twice a day. Take it only at bedtime if you need to be very alert during the daytime  IBUPROFEN (Motrin) 800 mg 1 tablets every twice a day as needed for pain.  Always take it with food because may cause gastritis and ulcers.  If you notice nausea, stomach pain, change in the color of stools --->  Stop the medicine and let us know  In between ibuprofen is okay to take OTC Tylenol  Call if not improving in the next 10 days  Next visit in 3 weeks, please make an appointment
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
## 2014-12-19 ENCOUNTER — Telehealth: Payer: Self-pay | Admitting: Internal Medicine

## 2014-12-19 NOTE — Telephone Encounter (Signed)
Caller name: Onalee HuaDavid  Relationship to patient: Self  Can be reached: [phone]  Reason for call: Pt called in to inform us that his FMLA paperwork will be faxed over today. He would like for us to be on a look out for it.

## 2014-12-21 NOTE — Telephone Encounter (Signed)
Wife called to see if we received paperwork. Pt may bring up a copy too.

## 2014-12-22 NOTE — Telephone Encounter (Signed)
Forms received, in process. JG//CMA

## 2014-12-22 NOTE — Telephone Encounter (Signed)
Forms forwarded to Dr. Drue NovelPaz for completion. JG//CMA

## 2014-12-22 NOTE — Telephone Encounter (Signed)
Pt called to f/u on forms. Please notify once sent

## 2014-12-25 NOTE — Telephone Encounter (Signed)
Forms faxed successfully to [employer] at [phone]. Sent for scanning. JG//CMA

## 2015-01-09 ENCOUNTER — Ambulatory Visit (INDEPENDENT_AMBULATORY_CARE_PROVIDER_SITE_OTHER): Payer: BLUE CROSS/BLUE SHIELD | Admitting: Internal Medicine

## 2015-01-09 ENCOUNTER — Encounter (INDEPENDENT_AMBULATORY_CARE_PROVIDER_SITE_OTHER): Payer: Self-pay

## 2015-01-09 ENCOUNTER — Encounter: Payer: Self-pay | Admitting: Internal Medicine

## 2015-01-09 VITALS — BP 118/74 | HR 73 | Temp 97.8°F | Ht 71.0 in | Wt 239.0 lb

## 2015-01-09 DIAGNOSIS — M542 Cervicalgia: Secondary | ICD-10-CM | POA: Diagnosis not present

## 2015-01-09 DIAGNOSIS — M545 Low back pain, unspecified: Secondary | ICD-10-CM

## 2015-01-09 MED ORDER — CYCLOBENZAPRINE HCL 10 MG PO TABS: 10.0000 mg | ORAL_TABLET | Freq: Two times a day (BID) | ORAL | Status: DC | PRN

## 2015-01-09 MED FILL — CYCLOBENZAPRINE 10 MG TAB: 10 | 20 days supply | Qty: 40 | Fill #0

## 2015-01-09 NOTE — Progress Notes (Signed)
Pre visit review using our clinic review tool, if applicable. No additional management support is needed unless otherwise documented below in the visit note. 
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
## 2015-01-09 NOTE — Patient Instructions (Signed)
BEFORE YOU LEAVE THE OFFICE: GO TO THE FRONT DESK  Schedule a complete physical exam to be done in 3 months Please be fasting    AFTER YOU LEAVE THE OFFICE:  Continue taking Flexeril, Motrin we are referring you to a physical therapist.

## 2015-01-22 ENCOUNTER — Telehealth: Payer: Self-pay | Admitting: Internal Medicine

## 2015-01-22 NOTE — Telephone Encounter (Signed)
Not received  

## 2015-01-22 NOTE — Telephone Encounter (Signed)
Pt states [employer] fax[facility] us paperwork 01/18/15 for intermittent claim for FMLA. Please f/u with pt.

## 2015-01-29 NOTE — Telephone Encounter (Signed)
Pt states [employer] refaxed papers last week. He is going to miss terms on his claim. He will call and provide them the 2 fax #s again.

## 2015-01-31 NOTE — Telephone Encounter (Signed)
No paperwork received as of 01/31/15.SLS

## 2015-02-01 ENCOUNTER — Ambulatory Visit (INDEPENDENT_AMBULATORY_CARE_PROVIDER_SITE_OTHER): Payer: BLUE CROSS/BLUE SHIELD | Admitting: Rehabilitative and Restorative Service Providers"

## 2015-02-01 ENCOUNTER — Encounter: Payer: Self-pay | Admitting: Rehabilitative and Restorative Service Providers"

## 2015-02-01 DIAGNOSIS — R293 Abnormal posture: Secondary | ICD-10-CM | POA: Diagnosis not present

## 2015-02-01 DIAGNOSIS — M623 Immobility syndrome (paraplegic): Secondary | ICD-10-CM

## 2015-02-01 DIAGNOSIS — Z7409 Other reduced mobility: Secondary | ICD-10-CM

## 2015-02-01 DIAGNOSIS — M542 Cervicalgia: Secondary | ICD-10-CM | POA: Diagnosis not present

## 2015-02-01 DIAGNOSIS — M2560 Stiffness of unspecified joint, not elsewhere classified: Secondary | ICD-10-CM

## 2015-02-01 NOTE — Telephone Encounter (Signed)
Pt states [employer] told him they have faxed [place] twice to both fax #s. Pt requesting that we contact them to get info. Ph# [phone] This is for FMLA and he is worried it will get denied bc time limit. Advised pt he should bring copies to our office.

## 2015-02-01 NOTE — Patient Instructions (Signed)
Ball massage with ~ 4 inch rubber ball   Axial Extension (Chin Tuck)    Pull chin in and lengthen back of neck. Hold __10-15__ seconds while counting out loud. Repeat __5-10__ times. Do _several ___ sessions per day.   Shoulder Blade Pinch    Pull arms back, pinching shoulder blades down and back, lifting chest.  Hold __10__ seconds. Relax. Repeat __10__ times. Do _several___ sessions per day.   TENS UNIT: This is helpful for muscle pain and spasm.   Search and Purchase a TENS 7000 2nd edition at www.tenspros.com. It should be less than $30.     TENS unit instructions: Do not shower or bathe with the unit on Turn the unit off before removing electrodes or batteries If the electrodes lose stickiness add a drop of water to the electrodes after they are disconnected from the unit and place on plastic sheet. If you continued to have difficulty, call the TENS unit company to purchase more electrodes. Do not apply lotion on the skin area prior to use. Make sure the skin is clean and dry as this will help prolong the life of the electrodes. After use, always check skin for unusual red areas, rash or other skin difficulties. If there are any skin problems, does not apply electrodes to the same area. Never remove the electrodes from the unit by pulling the wires. Do not use the TENS unit or electrodes other than as directed. Do not change electrode placement without consultating your therapist or physician. Keep 2 fingers with between each electrode.     Sleeping on Back  Place pillow under knees. A pillow with cervical support and a roll around waist are also helpful. Copyright  VHI. All rights reserved.  Sleeping on Side Place pillow between knees. Use cervical support under neck and a roll around waist as needed. Copyright  VHI. All rights reserved.   Sleeping on Stomach   If this is the only desirable sleeping position, place pillow under lower legs, and under stomach or  chest as needed.  Posture - Sitting   Sit upright, head facing forward. Try using a roll to support lower back. Keep shoulders relaxed, and avoid rounded back. Keep hips level with knees. Avoid crossing legs for long periods. Stand to Sit / Sit to Stand   To sit: Bend knees to lower self onto front edge of chair, then scoot back on seat. To stand: Reverse sequence by placing one foot forward, and scoot to front of seat. Use rocking motion to stand up.   Work Height and Reach  Ideal work height is no more than 2 to 4 inches below elbow level when standing, and at elbow level when sitting. Reaching should be limited to arm's length, with elbows slightly bent.  Bending  Bend at hips and knees, not back. Keep feet shoulder-width apart.    Posture - Standing   Good posture is important. Avoid slouching and forward head thrust. Maintain curve in low back and align ears over shoul- ders, hips over ankles.  Alternating Positions   Alternate tasks and change positions frequently to reduce fatigue and muscle tension. Take rest breaks. Computer Work   Position work to Art gallery manager. Use proper work and seat height. Keep shoulders back and down, wrists straight, and elbows at right angles. Use chair that provides full back support. Add footrest and lumbar roll as needed.  Getting Into / Out of Car  Lower self onto seat, scoot back, then bring in one  leg at a time. Reverse sequence to get out.  Dressing  Lie on back to pull socks or slacks over feet, or sit and bend leg while keeping back straight.    Housework - Sink  Place one foot on ledge of cabinet under sink when standing at sink for prolonged periods.   Pushing / Pulling  Pushing is preferable to pulling. Keep back in proper alignment, and use leg muscles to do the work.  Deep Squat   Squat and lift with both arms held against upper trunk. Tighten stomach muscles without holding breath. Use smooth movements to avoid  jerking.  Avoid Twisting   Avoid twisting or bending back. Pivot around using foot movements, and bend at knees if needed when reaching for articles.  Carrying Luggage   Distribute weight evenly on both sides. Use a cart whenever possible. Do not twist trunk. Move body as a unit.   Lifting Principles .Maintain proper posture and head alignment. .Slide object as close as possible before lifting. .Move obstacles out of the way. .Test before lifting; ask for help if too heavy. .Tighten stomach muscles without holding breath. .Use smooth movements; do not jerk. .Use legs to do the work, and pivot with feet. .Distribute the work load symmetrically and close to the center of trunk. .Push instead of pull whenever possible.   Ask For Help   Ask for help and delegate to others when possible. Coordinate your movements when lifting together, and maintain the low back curve.  Log Roll   Lying on back, bend left knee and place left arm across chest. Roll all in one movement to the right. Reverse to roll to the left. Always move as one unit. Housework - Sweeping  Use long-handled equipment to avoid stooping.   Housework - Wiping  Position yourself as close as possible to reach work surface. Avoid straining your back.  Laundry - Unloading Wash   To unload small items at bottom of washer, lift leg opposite to arm being used to reach.  Gardening - Raking  Move close to area to be raked. Use arm movements to do the work. Keep back straight and avoid twisting.     Cart  When reaching into cart with one arm, lift opposite leg to keep back straight.   Getting Into / Out of Bed  Lower self to lie down on one side by raising legs and lowering head at the same time. Use arms to assist moving without twisting. Bend both knees to roll onto back if desired. To sit up, start from lying on side, and use same move-ments in reverse. Housework - Vacuuming  Hold the vacuum with arm held at  side. Step back and forth to move it, keeping head up. Avoid twisting.   Laundry - Armed forces training and education officer so that bending and twisting can be avoided.   Laundry - Unloading Dryer  Squat down to reach into clothes dryer or use a reacher.  Gardening - Weeding / Psychiatric nurse or Kneel. Knee pads may be helpful.
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
## 2015-02-02 NOTE — Telephone Encounter (Signed)
LM to notify pt °

## 2015-02-02 NOTE — Telephone Encounter (Signed)
No paperwork received for patient; please make patient aware and ask if they are receiving fax confirmations/SLS

## 2015-02-09 ENCOUNTER — Telehealth: Payer: Self-pay | Admitting: *Deleted

## 2015-02-09 NOTE — Telephone Encounter (Signed)
Received Intermittent Leave paperwork for PT dates, completed as much as possible; forwarded to provider/SLS 02/03

## 2015-02-13 NOTE — Telephone Encounter (Signed)
Informed pt paperwork is with provider. Pt notes it has to be returned to [facility] before 02/22/15.

## 2015-02-15 ENCOUNTER — Encounter: Payer: BLUE CROSS/BLUE SHIELD | Admitting: Rehabilitative and Restorative Service Providers"

## 2015-02-16 NOTE — Telephone Encounter (Signed)
Paperwork faxed to [employer]; sent to scan & billing/SLS 02/10

## 2015-02-21 NOTE — Telephone Encounter (Signed)
Received new paperwork form [employer] stating that " terms such as 'Indefinite, ongoing, and as needed' are insufficient for FMLA protection"; forwarded information to provider, accepted chosen dates of 01/24/15 to 01/23/16 as start and end dates for Intermittent FMLA, repeated treatment/appointment numbers from previous completed paperwork; provider signed and paperwork faxed to [phone]/SLS 02/15

## 2015-03-08 ENCOUNTER — Encounter: Payer: Self-pay | Admitting: Physical Therapy

## 2015-04-10 ENCOUNTER — Telehealth: Payer: Self-pay | Admitting: Behavioral Health

## 2015-04-10 NOTE — Telephone Encounter (Signed)
Unable to reach patient at time of Pre-Visit Call.  Left message for patient to return call when available.    

## 2015-04-11 ENCOUNTER — Encounter: Payer: BLUE CROSS/BLUE SHIELD | Admitting: Internal Medicine

## 2015-04-12 ENCOUNTER — Telehealth: Payer: Self-pay | Admitting: Internal Medicine
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
## 2015-07-11 ENCOUNTER — Encounter: Payer: BLUE CROSS/BLUE SHIELD | Admitting: Internal Medicine

## 2015-07-23 ENCOUNTER — Ambulatory Visit (INDEPENDENT_AMBULATORY_CARE_PROVIDER_SITE_OTHER): Payer: BLUE CROSS/BLUE SHIELD | Admitting: Internal Medicine

## 2015-07-23 ENCOUNTER — Encounter: Payer: Self-pay | Admitting: Internal Medicine

## 2015-07-23 VITALS — BP 118/78 | HR 62 | Temp 97.6°F | Ht 71.0 in | Wt 238.2 lb

## 2015-07-23 DIAGNOSIS — F329 Major depressive disorder, single episode, unspecified: Secondary | ICD-10-CM

## 2015-07-23 DIAGNOSIS — F419 Anxiety disorder, unspecified: Secondary | ICD-10-CM | POA: Diagnosis not present

## 2015-07-23 DIAGNOSIS — K219 Gastro-esophageal reflux disease without esophagitis: Secondary | ICD-10-CM

## 2015-07-23 DIAGNOSIS — M542 Cervicalgia: Secondary | ICD-10-CM

## 2015-07-23 DIAGNOSIS — Z0000 Encounter for general adult medical examination without abnormal findings: Secondary | ICD-10-CM | POA: Diagnosis not present

## 2015-07-23 DIAGNOSIS — F32A Depression, unspecified: Secondary | ICD-10-CM

## 2015-07-23 MED ORDER — CYCLOBENZAPRINE HCL 10 MG PO TABS: 10.0000 mg | ORAL_TABLET | Freq: Two times a day (BID) | ORAL | Status: AC | PRN

## 2015-07-23 MED ORDER — OMEPRAZOLE 40 MG PO CPDR: 40.0000 mg | DELAYED_RELEASE_CAPSULE | Freq: Every day | ORAL | Status: AC

## 2015-07-23 MED ORDER — ESCITALOPRAM OXALATE 10 MG PO TABS
10.0000 mg | ORAL_TABLET | Freq: Every day | ORAL | Status: DC
Start: 2015-07-23 — End: 2015-09-24

## 2015-07-23 MED ORDER — ZOLPIDEM TARTRATE 10 MG PO TABS: 5.0000 mg | ORAL_TABLET | Freq: Every evening | ORAL | Status: DC | PRN

## 2015-07-23 MED FILL — CYCLOBENZAPRINE 10 MG TAB: 10 | 20 days supply | Qty: 40 | Fill #0

## 2015-07-23 MED FILL — ZOLPIDEM TARTRATE 10 MG TAB: 10 | 30 days supply | Qty: 30 | Fill #0

## 2015-07-23 MED FILL — ESCITALOPRAM 10 MG TABLET: 10 | 30 days supply | Qty: 30 | Fill #0

## 2015-07-23 MED FILL — OMEPRAZOLE DR 40 MG CAPSULE: 40 | 30 days supply | Qty: 30 | Fill #0

## 2015-07-23 NOTE — Progress Notes (Signed)
Pre visit review using our clinic review tool, if applicable. No additional management support is needed unless otherwise documented below in the visit note. 

## 2015-07-23 NOTE — Assessment & Plan Note (Addendum)
Td 2016 Diet and exercise discussed Labs: BMP, FLP, A1c.

## 2015-07-23 NOTE — Progress Notes (Signed)
Subjective:    Patient ID: Devin Tate., male    DOB: August 21, 1975, 40 y.o.   MRN: [id number]  DOS:  07/23/2015 Type of visit - description : cpx Interval history: Here for a CPX, has multiple other issues: Continue with insomnia, feels that he is ready for medication. For the last 2 months, he has been mildly depressed, no suicidal ideas, some marriage stress. Continue with neck pain, request a refill on Flexeril. Run out of PPIs 3 months ago, since then has mild heartburn and occasional odynophagia. No dysphagia. Denies side effects from PPIs he simply ran out. Right foot pain, plantar area. No injury.    Review of Systems  Other than above, ROS is negative  Constitutional: No fever. No chills. No unexplained wt changes. No unusual sweats  HEENT: No dental problems, no ear discharge, no facial swelling, no voice changes. No eye discharge, no eye  redness , no  intolerance to light   Respiratory: No wheezing , no  difficulty breathing. No cough , no mucus production  Cardiovascular: No CP, no leg swelling , no  Palpitations  GI: no nausea, no vomiting, no diarrhea , no  abdominal pain.  No blood in the stools.      Endocrine: No polyphagia, no polyuria , no polydipsia  GU: No dysuria, gross hematuria, difficulty urinating. No urinary urgency, no frequency.  Musculoskeletal: see above  Skin: No change in the color of the skin, palor , no  Rash  Allergic, immunologic: No environmental allergies , no  food allergies  Neurological: No dizziness no  syncope. No headaches. No diplopia, no slurred, no slurred speech, no motor deficits, no facial  Numbness  Hematological: No enlarged lymph nodes, no easy bruising , no unusual bleedings  Psychiatry: No suicidal ideas, no hallucinations, no beavior problems, no confusion.     Past Medical History  Diagnosis Date  . Asthma     as a child  . GERD (gastroesophageal reflux disease)   . Elevated BP   . UTI (lower  urinary tract infection)     h/o x 2     Past Surgical History  Procedure Laterality Date  . Shoulder surgery    . Wisdom tooth extraction      Social History   Social History  . Marital Status: Married    Spouse Name: N/A  . Number of Children: 3  . Years of Education: N/A   Occupational History  . machine operator     Social History Main Topics  . Smoking status: Never Smoker   . Smokeless tobacco: Never Used  . Alcohol Use: 0.0 oz/week    0 Standard drinks or equivalent per week     Comment: socially   . Drug Use: No  . Sexual Activity: Not on file   Other Topics Concern  . Not on file   Social History Narrative   3 children---17,15,9     Family History  Problem Relation Age of Onset  . Lupus Mother   . Colon cancer Neg Hx   . CAD Neg Hx   . Prostate cancer Father   . COPD Father     smoker  . Diabetes Father       Medication List       This list is accurate as of: 07/23/15 11:59 PM.  Always use your most recent med list.               acetaminophen 500 MG tablet  Commonly known as:  TYLENOL  Take 1 tablet (500 mg total) by mouth every 6 (six) hours as needed.     cyclobenzaprine 10 MG tablet  Commonly known as:  FLEXERIL  Take 1 tablet (10 mg total) by mouth 2 (two) times daily as needed for muscle spasms.     escitalopram 10 MG tablet  Commonly known as:  LEXAPRO  Take 1 tablet (10 mg total) by mouth daily.     ibuprofen 800 MG tablet  Commonly known as:  ADVIL,MOTRIN  Take 1 tablet (800 mg total) by mouth 2 (two) times daily as needed.     Melatonin 5 MG Tabs  Take 1 tablet by mouth at bedtime as needed. FOR INSOMNIA     omeprazole 40 MG capsule  Commonly known as:  PRILOSEC  Take 1 capsule (40 mg total) by mouth daily.     zolpidem 10 MG tablet  Commonly known as:  AMBIEN  Take 0.5-1 tablets (5-10 mg total) by mouth at bedtime as needed. for sleep           Objective:   Physical Exam BP 118/78 mmHg  Pulse 62  Temp(Src)  97.6 F (36.4 C) (Oral)  Ht 5\' 11"  (1.803 m)  Wt 238 lb 4 oz (108.069 kg)  BMI 33.24 kg/m2  SpO2 97% General:   Well developed, well nourished . NAD.  Neck: No  thyromegaly . No mass or LAD HEENT:  Normocephalic . Face symmetric, atraumatic Lungs:  CTA B Normal respiratory effort, no intercostal retractions, no accessory muscle use. Heart: RRR,  no murmur.  No pretibial edema bilaterally  Abdomen:  Not distended, soft, non-tender. No rebound or rigidity.   Skin: Exposed areas without rash. Not pale. Not jaundice Neurologic:  alert & oriented X3.  Speech normal, gait appropriate for age and unassisted Strength symmetric and appropriate for age.  Psych: Cognition and judgment appear intact.  Cooperative with normal attention span and concentration.  Behavior appropriate. No anxious or depressed appearing.      Assessment & Plan:   Assessment H/o anxiety-insomnia GERD Elevated BP Asthma as a child H/o UTI 2  Plan: Mild anxiety, persistent insomnia, now with depression: Recommend counseling, prescribe Ambien as needed for insomnia. PHQ 9: 19, moderate sx, we discussed SSRIs, feels like he likes to try, rx lexapro, s/e discussed GERD: After he ran out off PPIs heartburn and odynophagia resurface. Refill PPIs, recommend not to ran out. Neck pain: Ongoing problem, refill Flexeril, refer to ortho RTC 4-5 W  Today, I spent more than  15   min with the patient(In addition to CPX) counseling regards the new issue of depression and  coordinating his care.

## 2015-07-23 NOTE — Patient Instructions (Addendum)
GO TO THE LAB : Get the blood work     GO TO THE FRONT DESK Schedule your next appointment for a  routine checkup in 4-5 weeks    Take  your medications as prescribed  Please strongly consider see a counselor  Call if symptoms are not gradually improving

## 2015-07-24 NOTE — Assessment & Plan Note (Signed)
Mild anxiety, persistent insomnia, now with depression: Recommend counseling, prescribe Ambien as needed for insomnia. PHQ 9: 19, moderate sx, we discussed SSRIs, feels like he likes to try, rx lexapro, s/e discussed GERD: After he ran out off PPIs heartburn and odynophagia resurface. Refill PPIs, recommend not to ran out. Neck pain: Ongoing problem, refill Flexeril, refer to ortho RTC 4-5 W

## 2015-07-25 ENCOUNTER — Other Ambulatory Visit: Payer: BLUE CROSS/BLUE SHIELD

## 2015-07-25 LAB — LIPID PANEL
CHOLESTEROL: 134 mg/dL (ref 0–200)
HDL: 37.4 mg/dL — ABNORMAL LOW (ref >39.00)
LDL Cholesterol: 88 mg/dL (ref 0–99)
NonHDL: 96.97
Total CHOL/HDL Ratio: 4
Triglycerides: 43 mg/dL (ref 0.0–149.0)
VLDL: 8.6 mg/dL (ref 0.0–40.0)

## 2015-07-25 LAB — HEMOGLOBIN A1C: HEMOGLOBIN A1C: 5.2 % (ref 4.6–6.5)

## 2015-07-25 LAB — BASIC METABOLIC PANEL
BUN: 14 mg/dL (ref 6–23)
CO2: 29 mEq/L (ref 19–32)
CREATININE: 1.21 mg/dL (ref 0.40–1.50)
Calcium: 8.9 mg/dL (ref 8.4–10.5)
Chloride: 105 mEq/L (ref 96–112)
GFR: 85.54 mL/min (ref >60.00)
Glucose, Bld: 94 mg/dL (ref 70–99)
Potassium: 3.8 mEq/L (ref 3.5–5.1)
Sodium: 139 mEq/L (ref 135–145)

## 2015-07-25 NOTE — Addendum Note (Signed)
Addended by: Eustace QuailEABOLD, Lorin J on: 07/25/2015 07:09 AM   Modules accepted: Kipp BroodSmartSet

## 2015-08-27 ENCOUNTER — Ambulatory Visit: Payer: BLUE CROSS/BLUE SHIELD | Admitting: Internal Medicine

## 2015-09-24 ENCOUNTER — Encounter: Payer: Self-pay | Admitting: Internal Medicine

## 2015-09-24 ENCOUNTER — Ambulatory Visit (INDEPENDENT_AMBULATORY_CARE_PROVIDER_SITE_OTHER): Payer: BLUE CROSS/BLUE SHIELD | Admitting: Internal Medicine

## 2015-09-24 VITALS — BP 112/64 | HR 65 | Temp 98.3°F | Resp 14 | Ht 71.0 in | Wt 235.4 lb

## 2015-09-24 DIAGNOSIS — F419 Anxiety disorder, unspecified: Principal | ICD-10-CM

## 2015-09-24 DIAGNOSIS — F418 Other specified anxiety disorders: Secondary | ICD-10-CM | POA: Diagnosis not present

## 2015-09-24 DIAGNOSIS — M542 Cervicalgia: Secondary | ICD-10-CM

## 2015-09-24 DIAGNOSIS — F329 Major depressive disorder, single episode, unspecified: Secondary | ICD-10-CM

## 2015-09-24 MED ORDER — ESCITALOPRAM OXALATE 20 MG PO TABS: 20.0000 mg | ORAL_TABLET | Freq: Every day | ORAL | 3 refills | Status: AC

## 2015-09-24 MED FILL — OMEPRAZOLE DR 40 MG CAPSULE: 40 | 30 days supply | Qty: 30 | Fill #1

## 2015-09-24 MED FILL — ESCITALOPRAM 20 MG TABLET: 20 | 30 days supply | Qty: 30 | Fill #0

## 2015-09-24 MED FILL — ZOLPIDEM TARTRATE 10 MG TAB: 10 | 30 days supply | Qty: 30 | Fill #1

## 2015-09-24 NOTE — Patient Instructions (Signed)
Change to lexapro 20 mg : 1 tab a day  Next  visit in 6 to 8 weeks

## 2015-09-24 NOTE — Progress Notes (Signed)
Pre visit review using our clinic review tool, if applicable. No additional management support is needed unless otherwise documented below in the visit note. 
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
## 2015-10-23 ENCOUNTER — Telehealth: Payer: Self-pay

## 2015-10-23 MED ORDER — ZOLPIDEM TARTRATE 10 MG PO TABS
5.0000 mg | ORAL_TABLET | Freq: Every evening | ORAL | 1 refills | Status: AC | PRN
Start: 2015-10-23 — End: ?

## 2015-10-23 NOTE — Telephone Encounter (Signed)
Pt is requesting refill on Ambien.  Last OV: 09/24/2015 Last Fill: 07/23/2015 #30 and 1 RF   Please advise.

## 2015-10-23 NOTE — Telephone Encounter (Signed)
Okay #30 and one refill 

## 2015-10-23 NOTE — Telephone Encounter (Signed)
Rx printed, awaiting MD signature.  

## 2015-10-23 NOTE — Telephone Encounter (Signed)
Rx faxed to [facility]. 

## 2015-11-19 ENCOUNTER — Telehealth: Payer: Self-pay | Admitting: Internal Medicine

## 2015-11-19 NOTE — Telephone Encounter (Signed)
Called pt to remind of appt. He says that he will be here but have a concern. He says that he cant leave work until 4:00p and his appt Is at 4. Pt would like to know if it is okay that he is a few minutes late. I advised pt that he may have to reschedule but pt says that that will be his dilemma everyday until the first of the year.   Is this okay with PCP?

## 2015-11-19 NOTE — Telephone Encounter (Signed)
Please advise 

## 2015-11-19 NOTE — Telephone Encounter (Signed)
Pt is aware.  

## 2015-11-19 NOTE — Telephone Encounter (Signed)
Okay if few minutes late. Let the patient know.

## 2015-11-20 ENCOUNTER — Ambulatory Visit: Payer: BLUE CROSS/BLUE SHIELD | Admitting: Internal Medicine

## 2015-11-20 DIAGNOSIS — Z0289 Encounter for other administrative examinations: Secondary | ICD-10-CM

## 2015-11-20 NOTE — Telephone Encounter (Signed)
Patient was a No Show 11/14. Charge or No Charge?

## 2015-11-21 ENCOUNTER — Encounter: Payer: Self-pay | Admitting: Internal Medicine

## 2015-11-21 NOTE — Telephone Encounter (Signed)
Charge. 

## 2016-01-14 MED FILL — ZOLPIDEM TARTRATE 10 MG TAB: 10 | 30 days supply | Qty: 30 | Fill #0

## 2016-02-18 MED FILL — ZOLPIDEM TARTRATE 10 MG TAB: 10 | 30 days supply | Qty: 30 | Fill #1

## 2016-03-10 NOTE — Telephone Encounter (Signed)
That is ok 

## 2016-03-10 NOTE — Telephone Encounter (Signed)
Pt's and spouse received no show bill. She said that pt was unable to get off work. Advised that pt would need to call at least 24 hrs before missed appt. She express understanding.   She would like to know if fee can be reversed?    I will call back to advise once assist.    CB: [phone] Ala Bent(Jamanda)

## 2016-03-11 NOTE — Telephone Encounter (Signed)
Fee has been waived °

## 2016-03-18 ENCOUNTER — Telehealth: Payer: Self-pay | Admitting: Internal Medicine

## 2016-03-18 NOTE — Telephone Encounter (Signed)
No charge. 

## 2016-03-18 NOTE — Telephone Encounter (Signed)
: °  Relation to pt: self  Call back number: [phone]   Reason for call:  Patient requesting 11/19/16 $50 no show fee waived due to patient child being admitted to the hospital, please advise

## 2016-03-19 NOTE — Telephone Encounter (Signed)
Fees have been waived
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
## 2018-08-10 ENCOUNTER — Encounter: Payer: Self-pay | Admitting: Internal Medicine

## 2019-04-22 ENCOUNTER — Ambulatory Visit: Payer: BLUE CROSS/BLUE SHIELD

## 2020-02-10 ENCOUNTER — Telehealth: Payer: Self-pay | Admitting: Internal Medicine

## 2020-02-10 NOTE — Telephone Encounter (Signed)
Patient would like to re-establish care   Please advise  

## 2020-02-10 NOTE — Telephone Encounter (Signed)
Please advise 

## 2020-02-10 NOTE — Telephone Encounter (Signed)
Unable to take more patients at this time 

## 2020-02-10 NOTE — Telephone Encounter (Signed)
Please inform Pt of Dr. Leta Jungling response. Thank you.

## 2020-02-13 NOTE — Telephone Encounter (Signed)
Patient was made aware of Dr. Drue Novel decision

## 2023-10-29 ENCOUNTER — Encounter: Payer: Self-pay | Admitting: Physician Assistant
